# Patient Record
Sex: Male | Born: 1945 | Race: White | Hispanic: No | Marital: Married | Smoking: Never smoker
Health system: Southern US, Community
[De-identification: ages and names within clinical notes are randomized; demographics above are authoritative.]

## PROBLEM LIST (undated history)

## (undated) DIAGNOSIS — G20 Parkinson's disease: Secondary | ICD-10-CM

## (undated) DIAGNOSIS — R1310 Dysphagia, unspecified: Secondary | ICD-10-CM

## (undated) DIAGNOSIS — R471 Dysarthria and anarthria: Secondary | ICD-10-CM

## (undated) DIAGNOSIS — G20A1 Parkinson's disease without dyskinesia, without mention of fluctuations: Secondary | ICD-10-CM

## (undated) HISTORY — DX: Dysarthria and anarthria: R47.1

## (undated) HISTORY — DX: Parkinson's disease: G20

## (undated) HISTORY — DX: Dysphagia, unspecified: R13.10

## (undated) HISTORY — DX: Parkinson's disease without dyskinesia, without mention of fluctuations: G20.A1

---

## 1998-07-10 ENCOUNTER — Emergency Department (HOSPITAL_COMMUNITY): Admission: EM | Admit: 1998-07-10 | Discharge: 1998-07-10 | Payer: Self-pay | Admitting: Emergency Medicine

## 1998-12-12 ENCOUNTER — Encounter: Payer: Self-pay | Admitting: Emergency Medicine

## 1998-12-12 ENCOUNTER — Inpatient Hospital Stay (HOSPITAL_COMMUNITY): Admission: EM | Admit: 1998-12-12 | Discharge: 1998-12-15 | Payer: Self-pay | Admitting: Emergency Medicine

## 1999-04-12 ENCOUNTER — Encounter: Payer: Self-pay | Admitting: Emergency Medicine

## 1999-04-12 ENCOUNTER — Inpatient Hospital Stay (HOSPITAL_COMMUNITY): Admission: EM | Admit: 1999-04-12 | Discharge: 1999-04-16 | Payer: Self-pay | Admitting: Emergency Medicine

## 1999-04-12 ENCOUNTER — Encounter: Payer: Self-pay | Admitting: Orthopedic Surgery

## 1999-04-13 ENCOUNTER — Encounter: Payer: Self-pay | Admitting: Orthopedic Surgery

## 2003-05-14 ENCOUNTER — Emergency Department (HOSPITAL_COMMUNITY): Admission: EM | Admit: 2003-05-14 | Discharge: 2003-05-14 | Payer: Self-pay | Admitting: Emergency Medicine

## 2006-01-18 ENCOUNTER — Encounter: Admission: RE | Admit: 2006-01-18 | Discharge: 2006-01-18 | Payer: Self-pay | Admitting: Gastroenterology

## 2006-01-25 ENCOUNTER — Ambulatory Visit (HOSPITAL_COMMUNITY): Admission: RE | Admit: 2006-01-25 | Discharge: 2006-01-25 | Payer: Self-pay | Admitting: Gastroenterology

## 2006-04-12 ENCOUNTER — Encounter: Admission: RE | Admit: 2006-04-12 | Discharge: 2006-07-11 | Payer: Self-pay | Admitting: Otolaryngology

## 2006-05-01 ENCOUNTER — Encounter: Admission: RE | Admit: 2006-05-01 | Discharge: 2006-05-01 | Payer: Self-pay | Admitting: Neurology

## 2006-05-28 ENCOUNTER — Ambulatory Visit (HOSPITAL_COMMUNITY): Admission: RE | Admit: 2006-05-28 | Discharge: 2006-05-28 | Payer: Self-pay | Admitting: Otolaryngology

## 2006-06-28 ENCOUNTER — Ambulatory Visit (HOSPITAL_COMMUNITY): Admission: RE | Admit: 2006-06-28 | Discharge: 2006-06-28 | Payer: Self-pay | Admitting: Otolaryngology

## 2007-08-02 ENCOUNTER — Emergency Department (HOSPITAL_COMMUNITY): Admission: EM | Admit: 2007-08-02 | Discharge: 2007-08-02 | Payer: Self-pay | Admitting: Emergency Medicine

## 2007-08-24 ENCOUNTER — Encounter (INDEPENDENT_AMBULATORY_CARE_PROVIDER_SITE_OTHER): Payer: Self-pay | Admitting: Urology

## 2007-08-24 ENCOUNTER — Ambulatory Visit (HOSPITAL_BASED_OUTPATIENT_CLINIC_OR_DEPARTMENT_OTHER): Admission: RE | Admit: 2007-08-24 | Discharge: 2007-08-24 | Payer: Self-pay | Admitting: Urology

## 2007-09-03 ENCOUNTER — Emergency Department (HOSPITAL_COMMUNITY): Admission: EM | Admit: 2007-09-03 | Discharge: 2007-09-03 | Payer: Self-pay | Admitting: Emergency Medicine

## 2007-09-15 ENCOUNTER — Emergency Department (HOSPITAL_COMMUNITY): Admission: EM | Admit: 2007-09-15 | Discharge: 2007-09-15 | Payer: Self-pay | Admitting: Emergency Medicine

## 2008-07-06 ENCOUNTER — Ambulatory Visit (HOSPITAL_BASED_OUTPATIENT_CLINIC_OR_DEPARTMENT_OTHER): Admission: RE | Admit: 2008-07-06 | Discharge: 2008-07-06 | Payer: Self-pay | Admitting: Urology

## 2009-02-12 ENCOUNTER — Emergency Department (HOSPITAL_COMMUNITY): Admission: EM | Admit: 2009-02-12 | Discharge: 2009-02-12 | Payer: Self-pay | Admitting: Emergency Medicine

## 2010-03-14 ENCOUNTER — Other Ambulatory Visit (HOSPITAL_COMMUNITY): Payer: Self-pay | Admitting: Otolaryngology

## 2010-03-18 ENCOUNTER — Ambulatory Visit (HOSPITAL_COMMUNITY)
Admission: RE | Admit: 2010-03-18 | Discharge: 2010-03-18 | Disposition: A | Discharge status: Home / Self Care | Payer: MEDICARE | Source: Ambulatory Visit | Attending: Otolaryngology | Admitting: Otolaryngology

## 2010-03-18 DIAGNOSIS — R1310 Dysphagia, unspecified: Secondary | ICD-10-CM | POA: Insufficient documentation

## 2010-03-26 LAB — DIFFERENTIAL
Basophils Absolute: 0 10*3/uL (ref 0.0–0.1)
Basophils Relative: 0 % (ref 0–1)
Eosinophils Relative: 0 % (ref 0–5)
Monocytes Absolute: 0.8 10*3/uL (ref 0.1–1.0)
Neutrophils Relative %: 73 % (ref 43–77)

## 2010-03-26 LAB — COMPREHENSIVE METABOLIC PANEL
ALT: 14 U/L (ref 0–53)
AST: 14 U/L (ref 0–37)
Albumin: 3.8 g/dL (ref 3.5–5.2)
BUN: 11 mg/dL (ref 6–23)
CO2: 23 mEq/L (ref 19–32)
Calcium: 10.8 mg/dL — ABNORMAL HIGH (ref 8.4–10.5)
Chloride: 107 mEq/L (ref 96–112)
GFR calc Af Amer: >60 mL/min (ref >60)
GFR calc non Af Amer: >60 mL/min (ref >60)
Total Bilirubin: 1.2 mg/dL (ref 0.3–1.2)
Total Protein: 6.7 g/dL (ref 6.0–8.3)

## 2010-03-26 LAB — POCT CARDIAC MARKERS: Myoglobin, poc: 50.5 ng/mL (ref 12–200)

## 2010-03-26 LAB — CBC
HCT: 44.2 % (ref 39.0–52.0)
MCHC: 35.5 g/dL (ref 30.0–36.0)
MCV: 90.2 fL (ref 78.0–100.0)
Platelets: 157 10*3/uL (ref 150–400)
WBC: 5.4 10*3/uL (ref 4.0–10.5)

## 2010-04-13 LAB — POCT I-STAT 4, (NA,K, GLUC, HGB,HCT)
Glucose, Bld: 97 mg/dL (ref 70–99)
HCT: 46 % (ref 39.0–52.0)
Hemoglobin: 15.6 g/dL (ref 13.0–17.0)
Potassium: 4 mEq/L (ref 3.5–5.1)
Sodium: 141 mEq/L (ref 135–145)

## 2010-05-07 ENCOUNTER — Ambulatory Visit: Payer: MEDICARE | Admitting: Physical Therapy

## 2010-05-07 ENCOUNTER — Ambulatory Visit: Payer: MEDICARE | Attending: Neurology | Admitting: Physical Therapy

## 2010-05-07 ENCOUNTER — Ambulatory Visit: Payer: MEDICARE | Admitting: Speech Pathology

## 2010-05-07 ENCOUNTER — Encounter: Payer: MEDICARE | Admitting: Speech Pathology

## 2010-05-07 DIAGNOSIS — Z5189 Encounter for other specified aftercare: Secondary | ICD-10-CM | POA: Insufficient documentation

## 2010-05-07 DIAGNOSIS — R279 Unspecified lack of coordination: Secondary | ICD-10-CM | POA: Insufficient documentation

## 2010-05-07 DIAGNOSIS — R1310 Dysphagia, unspecified: Secondary | ICD-10-CM | POA: Insufficient documentation

## 2010-05-07 DIAGNOSIS — G20 Parkinson's disease: Secondary | ICD-10-CM | POA: Insufficient documentation

## 2010-05-07 DIAGNOSIS — R293 Abnormal posture: Secondary | ICD-10-CM | POA: Insufficient documentation

## 2010-05-07 DIAGNOSIS — G20A1 Parkinson's disease without dyskinesia, without mention of fluctuations: Secondary | ICD-10-CM | POA: Insufficient documentation

## 2010-05-07 DIAGNOSIS — R269 Unspecified abnormalities of gait and mobility: Secondary | ICD-10-CM | POA: Insufficient documentation

## 2010-05-19 ENCOUNTER — Ambulatory Visit: Payer: MEDICARE | Admitting: Physical Therapy

## 2010-05-20 ENCOUNTER — Other Ambulatory Visit (HOSPITAL_COMMUNITY): Payer: Self-pay | Admitting: Diagnostic Neuroimaging

## 2010-05-20 NOTE — Op Note (Signed)
Melvin Howell, Melvin Howell               ACCOUNT NO.:  [id number]   MEDICAL RECORD NO.:  [id number]          PATIENT TYPE:  AMB   LOCATION:  [facility]                         FACILITY:  [facility]   PHYSICIAN:  Mark C. Vernie Ammons, M.D.  DATE OF BIRTH:  1945-10-26   DATE OF PROCEDURE:  08/24/2007  DATE OF DISCHARGE:                               OPERATIVE REPORT   PREOPERATIVE DIAGNOSES:  1. Multiple bladder calculi.  2. Benign prostatic hypertrophy with bladder outlet obstruction.   POSTOPERATIVE DIAGNOSES:  1. Multiple bladder calculi (aggregate size ~5 cm.).  2. Benign prostatic hypertrophy with bladder outlet obstruction.   PROCEDURES:  1. Cystolitholapaxy.  2. Transurethral incision of the prostate.   ANESTHESIA:  General.   SPECIMENS:  Prostate chips to pathology and stones to patient.   BLOOD LOSS:  Minimal.   DRAINS:  24-French Foley catheter.   COMPLICATIONS:  None.   INDICATIONS:  The patient is a 65 year old white male who was found to  have multiple bladder calculi after being seen in the emergency room and  obtaining a CT scan.  He was having right flank pain at the time and has  known bilateral renal calculi.  He had no ureteral stones at the time of  the CT, but multiple bladder calculi were identified.  He is brought to  the OR today for cystolitholapaxy and transurethral incision of his  prostate since he clearly has some form of obstruction causing him to be  at increased risk for stone formation.  We discussed the risks,  complications, and alternatives.  He understands and elected to proceed.   DESCRIPTION OF OPERATION:  After informed consent, the patient was  brought to the major OR, placed on the table, administered general  anesthesia, and then moved to the dorsal lithotomy position.  His  genitalia was sterilely prepped and draped and official time-out was  then performed.  Initially the 22-French cystoscope with a 12-degree  lens was introduced per urethra and was  passed down the urethra under  direct vision.  The urethra was noted to be normal down to the  sphincter, which appears intact.  The prostatic urethra revealed  trilobar hypertrophy with a high bladder neck component.  The bladder  was then entered and I noted 1+ trabeculation.  On the floor of the  bladder, multiple small stones were identified, as well as three larger  stones and some intermediate sized stones as well.  No other  inflammatory lesions or tumors were identified within the bladder upon  complete inspection.  Ureteral orifices were noted to be of normal  configuration and position.   The 1000-micron holmium laser fiber was then passed through the  cystoscope and into the bladder and this was used to fully fragment the  large stones.  I then removed the cystoscope and inserted the  resectoscope sheath with Timberlake obturator.  I then used the  Microvasive evacuator to remove all of the stone fragments and then re-  inspection revealed all stone fragments had been completely removed from  the bladder.   I then inserted the resectoscope element with  the 12-degree lens and  resected at the 6 o'clock position and then on over to the 5 and 7  o'clock positions from the bladder neck back to the veru.  I resected  away essentially the large shelf of median lobe tissue and then used the  Microvasive evacuator to remove the prostatic chips from the bladder.  These were sent to pathology.  I then re-inspected the prostatic fossa  and cauterized all bleeding points with electrocautery and re-inspected  the bladder and noted no perforation or injury.  I therefore removed the  resectoscope and inserted the 24-French Foley catheter and connected  this to closed system drainage.  I will observe the patient overnight  and will plan to discharge him in the morning after his Foley catheter  has been removed.      Mark C. Vernie Ammons, M.D.  Electronically Signed     MCO/MEDQ  D:   08/24/2007  T:  08/24/2007  Job:  [id number]

## 2010-05-20 NOTE — Op Note (Signed)
NAMEGREGOR, DERSHEM               ACCOUNT NO.:  [id number]   MEDICAL RECORD NO.:  [id number]          PATIENT TYPE:  AMB   LOCATION:  [facility]                         FACILITY:  [facility]   PHYSICIAN:  Mark C. Vernie Ammons, M.D.  DATE OF BIRTH:  31-Dec-1945   DATE OF PROCEDURE:  07/06/2008  DATE OF DISCHARGE:                               OPERATIVE REPORT   PREOPERATIVE DIAGNOSIS:  History of bilateral distal ureteral calculi.   POSTOPERATIVE DIAGNOSES:  1. History of bilateral distal ureteral calculi.  2. Bladder calculi.   PROCEDURE:  Cystoscopy, bilateral retrograde pyelograms with  interpretation, extraction of bladder calculus.   SURGEON:  Dr. Vernie Ammons.   ASSISTANT:  Peggye Pitt.   ANESTHESIA:  General.   SPECIMEN:  Stone given to the patient.   DRAINS:  None.   BLOOD LOSS:  Minimal.   COMPLICATIONS:  None.   INDICATIONS:  The patient is a 65 year old male who had a distal right  ureteral stone.  It was being followed with conservative medical  expulsive therapy.  However, the stone failed to progress.  At the time  of his most recent KUB, the stone on the right-hand side remained  present in the distal ureter.  However, a previously noted left renal  calculus was now located at the ureterovesical junction region as well.  We therefore discussed ureteroscopic extraction due to the fact that he  had bilateral ureteral calculi.  The risks, complications and  alternatives were also discussed.  He understands and has elected to  proceed.   DESCRIPTION OF OPERATION:  After informed consent, the patient was  brought to the major OR, placed on the table, administered general  anesthesia, and then moved to the dorsal lithotomy position.  I had  discussed with the patient preoperatively if he had any pain, and he  said he had no further pain, but he also reported he had not seen any  stones pass.  His preoperative KUB, however, did not reveal the stones  that I could see on KUB in my  office.  There did appear to be a  calcification in the area of the prostatic urethra.   The patient was placed on the table, administered general anesthesia,  then moved to the dorsal lithotomy position, and his genitalia was  sterilely prepped and draped.  An official time-out was then performed.   A 22-French cystoscope with 12 degrees lens was then passed per urethra  under direct vision and urethra is noted be normal down to the sphincter  which appears intact.  The prostatic urethra was noted to have mild  bilobar hypertrophy and on the floor of the prostatic urethra just  inside the bladder neck was a calculus that appeared to be adherent to  the prostatic urethral epithelium.  I initially tried to grasp this with  a nitinol basket but was unsuccessful so I used the beak of the scope to  gently nudge the stone back into the bladder.  Once the stone was in the  bladder, the scope was then advanced into the bladder as well, and I  noted 1+ trabeculation.  Ureteral orifices appeared to be of normal  configuration and position.  However, there was some edema and  inflammation associated with the right ureteral orifice.   The nitinol basket was then used to grasp the stone that was previously  located in the prostatic urethra, and this was extracted.  I reinserted  the scope and irrigated out several small stone fragments that were on  noted to be on the floor of the bladder.   A 6-French open-end ureteral catheter was then passed through the  cystoscope and into the left orifice, and left retrograde pyelogram was  performed in standard fashion using full strength contrast.  Under  direct fluoroscopy, the entire length of the ureter as well as renal  collecting system was visualized and noted to be entirely normal.  Specifically, there was no evidence of filling defects or  hydronephrosis.  It appeared the stone had passed on the left-hand side.   A right retrograde pyelogram was  performed in an identical fashion and  with identical findings.   I then drained the bladder and removed the cystoscope, and the patient  was awakened and taken to recovery room in stable and satisfactory  condition.  He tolerated the procedure well, and there were no  intraoperative complications.   At this point he is completely free of stones and has no stents.  I  therefore going to have him return to my office for follow-up KUB to  make sure he is not forming new stones in 6 months.      Mark C. Vernie Ammons, M.D.  Electronically Signed     MCO/MEDQ  D:  07/06/2008  T:  07/06/2008  Job:  [id number]

## 2010-05-22 ENCOUNTER — Ambulatory Visit: Payer: MEDICARE | Admitting: Physical Therapy
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
## 2010-05-26 ENCOUNTER — Ambulatory Visit (HOSPITAL_COMMUNITY)
Admission: RE | Admit: 2010-05-26 | Discharge: 2010-05-26 | Disposition: A | Discharge status: Home / Self Care | Payer: Medicare Other | Source: Ambulatory Visit | Attending: Diagnostic Neuroimaging | Admitting: Diagnostic Neuroimaging

## 2010-05-26 DIAGNOSIS — R1310 Dysphagia, unspecified: Secondary | ICD-10-CM | POA: Insufficient documentation

## 2010-05-26 DIAGNOSIS — G20 Parkinson's disease: Secondary | ICD-10-CM | POA: Insufficient documentation

## 2010-05-26 DIAGNOSIS — G20A1 Parkinson's disease without dyskinesia, without mention of fluctuations: Secondary | ICD-10-CM | POA: Insufficient documentation

## 2010-05-27 ENCOUNTER — Ambulatory Visit: Payer: MEDICARE | Admitting: Physical Therapy

## 2010-05-29 ENCOUNTER — Ambulatory Visit: Payer: MEDICARE | Admitting: Physical Therapy

## 2010-06-04 ENCOUNTER — Ambulatory Visit: Payer: MEDICARE

## 2010-06-04 ENCOUNTER — Ambulatory Visit: Payer: MEDICARE | Admitting: Physical Therapy

## 2010-06-06 ENCOUNTER — Ambulatory Visit: Payer: MEDICARE | Admitting: Physical Therapy

## 2010-06-10 ENCOUNTER — Ambulatory Visit: Payer: MEDICARE | Admitting: Physical Therapy

## 2010-06-12 ENCOUNTER — Ambulatory Visit: Payer: MEDICARE | Admitting: Physical Therapy

## 2010-10-03 LAB — URINE MICROSCOPIC-ADD ON

## 2010-10-03 LAB — URINALYSIS, ROUTINE W REFLEX MICROSCOPIC
Glucose, UA: NEGATIVE
Specific Gravity, Urine: 1.024
pH: 5.5

## 2012-03-28 DIAGNOSIS — G20 Parkinson's disease: Secondary | ICD-10-CM

## 2012-03-28 DIAGNOSIS — E538 Deficiency of other specified B group vitamins: Secondary | ICD-10-CM

## 2012-03-28 DIAGNOSIS — R1310 Dysphagia, unspecified: Secondary | ICD-10-CM

## 2012-03-28 DIAGNOSIS — F03918 Unspecified dementia, unspecified severity, with other behavioral disturbance: Secondary | ICD-10-CM

## 2012-03-28 DIAGNOSIS — G20A1 Parkinson's disease without dyskinesia, without mention of fluctuations: Secondary | ICD-10-CM

## 2012-03-28 DIAGNOSIS — F0391 Unspecified dementia with behavioral disturbance: Secondary | ICD-10-CM

## 2012-05-02 DIAGNOSIS — E538 Deficiency of other specified B group vitamins: Secondary | ICD-10-CM

## 2012-05-02 DIAGNOSIS — G20 Parkinson's disease: Secondary | ICD-10-CM

## 2012-05-02 DIAGNOSIS — F0391 Unspecified dementia with behavioral disturbance: Secondary | ICD-10-CM

## 2012-05-02 DIAGNOSIS — R1310 Dysphagia, unspecified: Secondary | ICD-10-CM

## 2012-05-18 DIAGNOSIS — R1310 Dysphagia, unspecified: Secondary | ICD-10-CM

## 2012-05-18 DIAGNOSIS — E538 Deficiency of other specified B group vitamins: Secondary | ICD-10-CM

## 2012-05-18 DIAGNOSIS — G20 Parkinson's disease: Secondary | ICD-10-CM

## 2012-05-18 DIAGNOSIS — F0391 Unspecified dementia with behavioral disturbance: Secondary | ICD-10-CM

## 2012-06-20 DIAGNOSIS — G20 Parkinson's disease: Secondary | ICD-10-CM

## 2012-06-20 DIAGNOSIS — R1310 Dysphagia, unspecified: Secondary | ICD-10-CM

## 2012-06-20 DIAGNOSIS — E538 Deficiency of other specified B group vitamins: Secondary | ICD-10-CM

## 2012-06-20 DIAGNOSIS — F0391 Unspecified dementia with behavioral disturbance: Secondary | ICD-10-CM

## 2012-07-27 ENCOUNTER — Non-Acute Institutional Stay (SKILLED_NURSING_FACILITY): Payer: PRIVATE HEALTH INSURANCE | Admitting: Internal Medicine

## 2012-07-27 DIAGNOSIS — F0390 Unspecified dementia without behavioral disturbance: Secondary | ICD-10-CM

## 2012-07-27 DIAGNOSIS — I10 Essential (primary) hypertension: Secondary | ICD-10-CM

## 2012-07-27 DIAGNOSIS — E538 Deficiency of other specified B group vitamins: Secondary | ICD-10-CM

## 2012-07-27 DIAGNOSIS — G20 Parkinson's disease: Secondary | ICD-10-CM

## 2012-08-26 DIAGNOSIS — I10 Essential (primary) hypertension: Secondary | ICD-10-CM | POA: Insufficient documentation

## 2012-08-26 DIAGNOSIS — E538 Deficiency of other specified B group vitamins: Secondary | ICD-10-CM | POA: Insufficient documentation

## 2012-08-26 DIAGNOSIS — F0390 Unspecified dementia without behavioral disturbance: Secondary | ICD-10-CM | POA: Insufficient documentation

## 2012-08-26 DIAGNOSIS — G20 Parkinson's disease: Secondary | ICD-10-CM | POA: Insufficient documentation

## 2012-08-26 NOTE — Progress Notes (Signed)
Patient ID: Melvin Howell, male   DOB: September 15, 1945, 67 y.o.   MRN: [id number]        PROGRESS NOTE  DATE: 07/27/2012  FACILITY: Nursing Home Location: [facility]  LEVEL OF CARE: SNF (31)  Routine Visit  CHIEF COMPLAINT:  Manage hypertension, Parkinson's disease, and dementia.    HISTORY OF PRESENT ILLNESS:  REASSESSMENT OF ONGOING PROBLEM(S):    HTN: Pt 's HTN remains stable.  Denies CP, sob, DOE, pedal edema, headaches, dizziness or visual disturbances.  No complications from the medications currently being used.  Last BP :  156/74.     PARKINSON'S DISEASE: pt's Parkinson's disease is stable.  Denies progression of sx recently.  Pt is tolerating Parkinson's disease medications without any complications.    DEMENTIA: The dementia remains stable and continues to function adequately in the current living environment with supervision.  The patient has had little changes in behavior. No complications noted from the medications presently being used.   PAST MEDICAL HISTORY : Reviewed.  No changes.  CURRENT MEDICATIONS: Reviewed per [facility]  REVIEW OF SYSTEMS:  GENERAL: no change in appetite, no fatigue, no weight changes, no fever, chills or weakness RESPIRATORY: no cough, SOB, DOE, wheezing, hemoptysis CARDIAC: no chest pain, edema or palpitations GI: no abdominal pain, diarrhea, constipation, heart burn, nausea or vomiting  PHYSICAL EXAMINATION  VS:  T 96.9      P 64     RR 16     BP 156/74    POX %     WT (Lb) 146  GENERAL: no acute distress, normal body habitus EYES: conjunctivae normal, sclerae normal, normal eye lids NECK: supple, trachea midline, no neck masses, no thyroid tenderness, no thyromegaly LYMPHATICS: no LAN in the neck, no supraclavicular LAN RESPIRATORY: breathing is even & unlabored, BS CTAB CARDIAC: RRR, no murmur,no extra heart sounds, no edema GI: abdomen soft, normal BS, no masses, no tenderness, no hepatomegaly, no  splenomegaly PSYCHIATRIC: the patient is alert & oriented to person, affect & behavior appropriate  LABS/RADIOLOGY: 06/2012:  Liver profile normal.    CBC normal.    BMP normal except calcium 11.3.    ASSESSMENT/PLAN:  Hypertension.  We will review a BP log.  Last blood pressure elevated.     Parkinson's disease.  Continue Sinemet.    Dementia.  Stable.    History of B12 deficiency.  Continue supplementation.    Hypercalcemia.  Chronic problem.  We will monitor.    Dysphagia.  Continue current dysphagia diet.    CPT CODE: 32440

## 2012-08-29 ENCOUNTER — Non-Acute Institutional Stay (SKILLED_NURSING_FACILITY): Payer: PRIVATE HEALTH INSURANCE | Admitting: Internal Medicine

## 2012-08-29 DIAGNOSIS — E538 Deficiency of other specified B group vitamins: Secondary | ICD-10-CM

## 2012-08-29 DIAGNOSIS — G20 Parkinson's disease: Secondary | ICD-10-CM

## 2012-08-29 DIAGNOSIS — I10 Essential (primary) hypertension: Secondary | ICD-10-CM

## 2012-08-29 DIAGNOSIS — G20A1 Parkinson's disease without dyskinesia, without mention of fluctuations: Secondary | ICD-10-CM

## 2012-08-29 DIAGNOSIS — F0390 Unspecified dementia without behavioral disturbance: Secondary | ICD-10-CM

## 2012-08-31 NOTE — Progress Notes (Signed)
Patient ID: Melvin Howell, male   DOB: [birth year], 67 y.o.   MRN: [id number]        PROGRESS NOTE  DATE: 08/29/2012  FACILITY: Nursing Home Location: [facility]  LEVEL OF CARE: SNF (31)  Routine Visit  CHIEF COMPLAINT:  Manage hypertension, Parkinson's disease, and dementia.    HISTORY OF PRESENT ILLNESS:  REASSESSMENT OF ONGOING PROBLEM(S):    HTN: Pt 's HTN remains stable.  Denies CP, sob, DOE, pedal edema, headaches, dizziness or visual disturbances.  No complications from the medications currently being used.  Last BP :  156/74, 118/80.     PARKINSON'S DISEASE: pt's Parkinson's disease is stable.  Denies progression of sx recently.  Pt is tolerating Parkinson's disease medications without any complications.    DEMENTIA: The dementia remains stable and continues to function adequately in the current living environment with supervision.  The patient has had little changes in behavior. No complications noted from the medications presently being used.   PAST MEDICAL HISTORY : Reviewed.  No changes.  CURRENT MEDICATIONS: Reviewed per [facility]  REVIEW OF SYSTEMS:  GENERAL: no change in appetite, no fatigue, no weight changes, no fever, chills or weakness RESPIRATORY: no cough, SOB, DOE, wheezing, hemoptysis CARDIAC: no chest pain, edema or palpitations GI: no abdominal pain, diarrhea, constipation, heart burn, nausea or vomiting  PHYSICAL EXAMINATION  VS:  T 98.6.      P 64     RR 18     BP 118/80   POX %     WT (Lb) 147  GENERAL: no acute distress, normal body habitus EYES: conjunctivae normal, sclerae normal, normal eye lids NECK: supple, trachea midline, no neck masses, no thyroid tenderness, no thyromegaly LYMPHATICS: no LAN in the neck, no supraclavicular LAN RESPIRATORY: breathing is even & unlabored, BS CTAB CARDIAC: RRR, no murmur,no extra heart sounds, no edema GI: abdomen soft, normal BS, no masses, no tenderness, no hepatomegaly, no  splenomegaly PSYCHIATRIC: the patient is alert & oriented to person, affect & behavior appropriate  LABS/RADIOLOGY: 06/2012:  Liver profile normal.    CBC normal.    BMP normal except calcium 11.3.    ASSESSMENT/PLAN:  Hypertension.  Well controlled    Parkinson's disease.  Continue Sinemet.    Dementia.  Stable.    History of B12 deficiency.  Continue supplementation.    Hypercalcemia.  Chronic problem.  We will monitor.    Dysphagia.  Continue current dysphagia diet.    CPT CODE: 78295

## 2012-09-26 ENCOUNTER — Non-Acute Institutional Stay (SKILLED_NURSING_FACILITY): Payer: PRIVATE HEALTH INSURANCE | Admitting: Internal Medicine

## 2012-09-26 DIAGNOSIS — F0390 Unspecified dementia without behavioral disturbance: Secondary | ICD-10-CM

## 2012-09-26 DIAGNOSIS — E538 Deficiency of other specified B group vitamins: Secondary | ICD-10-CM

## 2012-09-26 DIAGNOSIS — I10 Essential (primary) hypertension: Secondary | ICD-10-CM

## 2012-09-26 DIAGNOSIS — G20 Parkinson's disease: Secondary | ICD-10-CM

## 2012-09-27 NOTE — Progress Notes (Signed)
Patient ID: Melvin Howell, male   DOB: [birth year], 67 y.o.   MRN: [id number]        PROGRESS NOTE  DATE: 09/26/2012  FACILITY: Nursing Home Location: [facility]  LEVEL OF CARE: SNF (31)  Routine Visit  CHIEF COMPLAINT:  Manage hypertension, Parkinson's disease, and dementia.    HISTORY OF PRESENT ILLNESS:  REASSESSMENT OF ONGOING PROBLEM(S):    HTN: Pt 's HTN remains stable.  Denies CP, sob, DOE, pedal edema, headaches, dizziness or visual disturbances.  No complications from the medications currently being used.  Last BP :  156/74, 118/80, 140/89.     PARKINSON'S DISEASE: pt's Parkinson's disease is stable.  Denies progression of sx recently.  Pt is tolerating Parkinson's disease medications without any complications.    DEMENTIA: The dementia remains stable and continues to function adequately in the current living environment with supervision.  The patient has had little changes in behavior. No complications noted from the medications presently being used.   PAST MEDICAL HISTORY : Reviewed.  No changes.  CURRENT MEDICATIONS: Reviewed per [facility]  REVIEW OF SYSTEMS:  GENERAL: no change in appetite, no fatigue, no weight changes, no fever, chills or weakness RESPIRATORY: no cough, SOB, DOE, wheezing, hemoptysis CARDIAC: no chest pain, edema or palpitations GI: no abdominal pain, diarrhea, constipation, heart burn, nausea or vomiting  PHYSICAL EXAMINATION  VS:  T 97.2.      P 60     RR 18     BP 140/89   POX %     WT (Lb) 148  GENERAL: no acute distress, normal body habitus EYES: conjunctivae normal, sclerae normal, normal eye lids NECK: supple, trachea midline, no neck masses, no thyroid tenderness, no thyromegaly LYMPHATICS: no LAN in the neck, no supraclavicular LAN RESPIRATORY: breathing is even & unlabored, BS CTAB CARDIAC: RRR, no murmur,no extra heart sounds, no edema GI: abdomen soft, normal BS, no masses, no tenderness, no hepatomegaly, no  splenomegaly PSYCHIATRIC: the patient is alert & oriented to person, affect & behavior appropriate  LABS/RADIOLOGY:  9-14 calcium 11.5 otherwise BMP normal 06/2012:  Liver profile normal.    CBC normal.    BMP normal except calcium 11.3.    ASSESSMENT/PLAN:  Hypertension. Borderline. Will monitor  Parkinson's disease.  Continue Sinemet.    Dementia.  Stable.    History of B12 deficiency.  Continue supplementation.    Hypercalcemia.  Chronic problem.  We will monitor.    Dysphagia.  Continue current dysphagia diet.    CPT CODE: 40981

## 2012-11-01 ENCOUNTER — Non-Acute Institutional Stay (SKILLED_NURSING_FACILITY): Payer: PRIVATE HEALTH INSURANCE | Admitting: Internal Medicine

## 2012-11-01 DIAGNOSIS — G20 Parkinson's disease: Secondary | ICD-10-CM

## 2012-11-01 DIAGNOSIS — I10 Essential (primary) hypertension: Secondary | ICD-10-CM

## 2012-11-01 DIAGNOSIS — E538 Deficiency of other specified B group vitamins: Secondary | ICD-10-CM

## 2012-11-01 DIAGNOSIS — F0390 Unspecified dementia without behavioral disturbance: Secondary | ICD-10-CM

## 2012-11-04 NOTE — Progress Notes (Signed)
Patient ID: Melvin Howell, male   DOB: [birth year], 67 y.o.   MRN: [id number]        PROGRESS NOTE  DATE: 11/01/2012  FACILITY: Nursing Home Location: [facility]  LEVEL OF CARE: SNF (31)  Routine Visit  CHIEF COMPLAINT:  Manage hypertension, Parkinson's disease, and dementia.    HISTORY OF PRESENT ILLNESS:  REASSESSMENT OF ONGOING PROBLEM(S):    HTN: Pt 's HTN remains stable.  Denies CP, sob, DOE, pedal edema, headaches, dizziness or visual disturbances.  No complications from the medications currently being used.  Last BP :  156/74, 118/80, 140/89, 134/72.     PARKINSON'S DISEASE: pt's Parkinson's disease is stable.  Denies progression of sx recently.  Pt is tolerating Parkinson's disease medications without any complications.    DEMENTIA: The dementia remains stable and continues to function adequately in the current living environment with supervision.  The patient has had little changes in behavior. No complications noted from the medications presently being used.   PAST MEDICAL HISTORY : Reviewed.  No changes.  CURRENT MEDICATIONS: Reviewed per [facility]  REVIEW OF SYSTEMS: Difficult to obtain, patient is a poor historian  PHYSICAL EXAMINATION  VS:  T 98.      P 68     RR 14     BP 134/72   POX %     WT (Lb) 148  GENERAL: no acute distress, normal body habitus EYES: conjunctivae normal, sclerae normal, normal eye lids NECK: supple, trachea midline, no neck masses, no thyroid tenderness, no thyromegaly LYMPHATICS: no LAN in the neck, no supraclavicular LAN RESPIRATORY: breathing is even & unlabored, BS CTAB CARDIAC: RRR, no murmur,no extra heart sounds, no edema GI: abdomen soft, normal BS, no masses, no tenderness, no hepatomegaly, no splenomegaly PSYCHIATRIC: the patient is alert & oriented to person, affect & behavior appropriate  LABS/RADIOLOGY:  9-14 calcium 11.5 otherwise BMP normal 06/2012:  Liver profile normal.    CBC normal.    BMP normal  except calcium 11.3.    ASSESSMENT/PLAN:  Hypertension. Borderline. Will monitor  Parkinson's disease.  Continue Sinemet.    Dementia.  Stable.    History of B12 deficiency.  Continue supplementation.    Hypercalcemia.  Chronic problem.  We will monitor.    Dysphagia.  Continue current dysphagia diet.    CPT CODE: 11914

## 2012-11-22 ENCOUNTER — Non-Acute Institutional Stay (SKILLED_NURSING_FACILITY): Payer: PRIVATE HEALTH INSURANCE | Admitting: Internal Medicine

## 2012-11-22 ENCOUNTER — Encounter: Payer: Self-pay | Admitting: Internal Medicine

## 2012-11-22 DIAGNOSIS — F0390 Unspecified dementia without behavioral disturbance: Secondary | ICD-10-CM

## 2012-11-22 DIAGNOSIS — G20 Parkinson's disease: Secondary | ICD-10-CM

## 2012-11-22 DIAGNOSIS — E538 Deficiency of other specified B group vitamins: Secondary | ICD-10-CM

## 2012-11-22 DIAGNOSIS — I10 Essential (primary) hypertension: Secondary | ICD-10-CM

## 2012-11-22 NOTE — Progress Notes (Signed)
Patient ID: Melvin Howell, male   DOB: 1945/03/03, 67 y.o.   MRN: [id number]        PROGRESS NOTE  DATE: 11/22/2012  FACILITY: Nursing Home Location: [facility]  LEVEL OF CARE: SNF (31)  Routine Visit  CHIEF COMPLAINT:  Manage hypertension, Parkinson's disease, and dementia.    HISTORY OF PRESENT ILLNESS:  REASSESSMENT OF ONGOING PROBLEM(S):    HTN: Pt 's HTN remains stable.  Denies CP, sob, DOE, pedal edema, headaches, dizziness or visual disturbances.  No complications from the medications currently being used.  Last BP :  156/74, 118/80, 140/89, 134/72, 112/68.     PARKINSON'S DISEASE: pt's Parkinson's disease is stable.  Denies progression of sx recently.  Pt is tolerating Parkinson's disease medications without any complications.    DEMENTIA: The dementia remains stable and continues to function adequately in the current living environment with supervision.  The patient has had little changes in behavior. No complications noted from the medications presently being used.   PAST MEDICAL HISTORY : Reviewed.  No changes.  CURRENT MEDICATIONS: Reviewed per [facility]  REVIEW OF SYSTEMS: Difficult to obtain, patient is a poor historian  PHYSICAL EXAMINATION  VS:  T 97.2      P 78     RR 18     BP 112/68   POX %     WT (Lb) 143  GENERAL: no acute distress, normal body habitus EYES: conjunctivae normal, sclerae normal, normal eye lids NECK: supple, trachea midline, no neck masses, no thyroid tenderness, no thyromegaly LYMPHATICS: no LAN in the neck, no supraclavicular LAN RESPIRATORY: breathing is even & unlabored, BS CTAB CARDIAC: RRR, no murmur,no extra heart sounds, no edema GI: abdomen soft, normal BS, no masses, no tenderness, no hepatomegaly, no splenomegaly PSYCHIATRIC: the patient is alert & oriented to person, affect & behavior appropriate  LABS/RADIOLOGY:  8-14 TSH 1.13  9-14 calcium 11.5 otherwise BMP normal 06/2012:  Liver profile normal.    CBC  normal.    BMP normal except calcium 11.3.    ASSESSMENT/PLAN:  Hypertension. Well controlled.  Parkinson's disease.  Continue Sinemet.    Dementia.  Stable.    History of B12 deficiency.  Continue supplementation.    Hypercalcemia.  Chronic problem.  We will monitor.    Dysphagia.  Continue current dysphagia diet.  UTI-Cipro was started    CPT CODE: 40981

## 2012-12-13 ENCOUNTER — Non-Acute Institutional Stay (SKILLED_NURSING_FACILITY): Payer: PRIVATE HEALTH INSURANCE | Admitting: Internal Medicine

## 2012-12-13 DIAGNOSIS — I10 Essential (primary) hypertension: Secondary | ICD-10-CM

## 2012-12-13 DIAGNOSIS — E538 Deficiency of other specified B group vitamins: Secondary | ICD-10-CM

## 2012-12-13 DIAGNOSIS — F0390 Unspecified dementia without behavioral disturbance: Secondary | ICD-10-CM

## 2012-12-13 DIAGNOSIS — G20 Parkinson's disease: Secondary | ICD-10-CM

## 2012-12-16 ENCOUNTER — Encounter: Payer: Self-pay | Admitting: Internal Medicine

## 2012-12-16 NOTE — Progress Notes (Signed)
Patient ID: Melvin Howell, male   DOB: 1945-02-11, 67 y.o.   MRN: [id number]        PROGRESS NOTE  DATE: 12/13/2012  FACILITY: Nursing Home Location: [facility]  LEVEL OF CARE: SNF (31)  Routine Visit  CHIEF COMPLAINT:  Manage hypertension, Parkinson's disease, and dementia.    HISTORY OF PRESENT ILLNESS:  REASSESSMENT OF ONGOING PROBLEM(S):    HTN: Pt 's HTN remains stable.  Denies CP, sob, DOE, pedal edema, headaches, dizziness or visual disturbances.  No complications from the medications currently being used.  Last BP :  156/74, 118/80, 140/89, 134/72, 112/68, 132/78.     PARKINSON'S DISEASE: pt's Parkinson's disease is stable.  Denies progression of sx recently.  Pt is tolerating Parkinson's disease medications without any complications.    DEMENTIA: The dementia remains stable and continues to function adequately in the current living environment with supervision.  The patient has had little changes in behavior. No complications noted from the medications presently being used.   PAST MEDICAL HISTORY : Reviewed.  No changes.  CURRENT MEDICATIONS: Reviewed per [facility]  REVIEW OF SYSTEMS: Difficult to obtain, patient is a poor historian  PHYSICAL EXAMINATION  VS:  T 97.1      P 76     RR 18     BP 132/78   POX %     WT (Lb) 145  GENERAL: no acute distress, normal body habitus EYES: conjunctivae normal, sclerae normal, normal eye lids NECK: supple, trachea midline, no neck masses, no thyroid tenderness, no thyromegaly LYMPHATICS: no LAN in the neck, no supraclavicular LAN RESPIRATORY: breathing is even & unlabored, BS CTAB CARDIAC: RRR, no murmur,no extra heart sounds, no edema GI: abdomen soft, normal BS, no masses, no tenderness, no hepatomegaly, no splenomegaly PSYCHIATRIC: the patient is alert & oriented to person, affect & behavior appropriate  LABS/RADIOLOGY:  11-14 CBC normal, calcium 11.1, total protein 5.4 otherwise CMP normal  8-14 TSH  1.13  9-14 calcium 11.5 otherwise BMP normal 06/2012:  Liver profile normal.    CBC normal.    BMP normal except calcium 11.3.    ASSESSMENT/PLAN:  Hypertension. Well controlled.  Parkinson's disease.  Continue Sinemet.    Dementia.  Stable.    History of B12 deficiency.  Continue supplementation.    Hypercalcemia.  Chronic problem.  We will monitor.    Dysphagia.  Continue current dysphagia diet.   CPT CODE: 40981

## 2013-01-24 ENCOUNTER — Non-Acute Institutional Stay (SKILLED_NURSING_FACILITY): Payer: PRIVATE HEALTH INSURANCE | Admitting: Internal Medicine

## 2013-01-24 DIAGNOSIS — F0390 Unspecified dementia without behavioral disturbance: Secondary | ICD-10-CM

## 2013-01-24 DIAGNOSIS — G20 Parkinson's disease: Secondary | ICD-10-CM

## 2013-01-24 DIAGNOSIS — I10 Essential (primary) hypertension: Secondary | ICD-10-CM

## 2013-01-24 DIAGNOSIS — E538 Deficiency of other specified B group vitamins: Secondary | ICD-10-CM

## 2013-01-24 NOTE — Progress Notes (Signed)
Patient ID: Melvin Howell, male   DOB: [birth year], 68 y.o.   MRN: [id number]         PROGRESS NOTE  DATE: 01/24/2013  FACILITY: Nursing Home [facility]  LEVEL OF CARE: SNF (31)  Routine Visit  CHIEF COMPLAINT:  Manage hypertension, Parkinson's disease, and dementia.    HISTORY OF PRESENT ILLNESS:  REASSESSMENT OF ONGOING PROBLEM(S):    HTN: Pt 's HTN remains stable.  Denies CP, sob, DOE, pedal edema, headaches, dizziness or visual disturbances.  No complications from the medications currently being used.  Last BP :  156/74, 118/80, 140/89, 134/72, 112/68, 132/78, 106/60.     PARKINSON'S DISEASE: pt's Parkinson's disease is stable.  Denies progression of sx recently.  Pt is tolerating Parkinson's disease medications without any complications.    DEMENTIA: The dementia remains stable and continues to function adequately in the current living environment with supervision.  The patient has had little changes in behavior. No complications noted from the medications presently being used.   PAST MEDICAL HISTORY : Reviewed.  No changes.  [facility]  REVIEW OF SYSTEMS: Difficult to obtain, patient is a poor historian  PHYSICAL EXAMINATION  VS:  T 97.6      P 60    RR 18     BP 106/60  POX %     WT (Lb) 145  GENERAL: no acute distress, normal body habitus EYES: conjunctivae normal, sclerae normal, normal eye lids NECK: supple, trachea midline, no neck masses, no thyroid tenderness, no thyromegaly LYMPHATICS: no LAN in the neck, no supraclavicular LAN RESPIRATORY: breathing is even & unlabored, BS CTAB CARDIAC: RRR, no murmur,no extra heart sounds, no edema GI: abdomen soft, normal BS, no masses, no tenderness, no hepatomegaly, no splenomegaly PSYCHIATRIC: the patient is alert & oriented to person, affect & behavior appropriate  LABS/RADIOLOGY:  12-14 ionized calcium 6.4, intact PTH 61, CBC normal  11-14 CBC normal, calcium  11.1, total protein 5.4 otherwise CMP normal  8-14 TSH 1.13  9-14 calcium 11.5 otherwise BMP normal 06/2012:  Liver profile normal.    CBC normal.    BMP normal except calcium 11.3.    ASSESSMENT/PLAN:  Hypertension. Well controlled. Lisinopril was decreased.  Parkinson's disease.  Continue Sinemet.    Dementia.  Stable.    History of B12 deficiency.  Continue supplementation.    Hypercalcemia.  Chronic problem.  We will monitor.    Dysphagia.  Continue current dysphagia diet.   Protein calorie malnutrition-boost plus started  CPT CODE: [id number]

## 2013-02-21 ENCOUNTER — Encounter: Payer: Self-pay | Admitting: *Deleted

## 2013-04-11 ENCOUNTER — Emergency Department (HOSPITAL_COMMUNITY): Payer: Medicare Other

## 2013-04-11 ENCOUNTER — Encounter (HOSPITAL_COMMUNITY): Payer: Self-pay | Admitting: Emergency Medicine

## 2013-04-11 ENCOUNTER — Emergency Department (HOSPITAL_COMMUNITY)
Admission: EM | Admit: 2013-04-11 | Discharge: 2013-04-11 | Disposition: A | Discharge status: Home / Self Care | Payer: Medicare Other | Attending: Emergency Medicine | Admitting: Emergency Medicine

## 2013-04-11 DIAGNOSIS — R1310 Dysphagia, unspecified: Secondary | ICD-10-CM | POA: Insufficient documentation

## 2013-04-11 DIAGNOSIS — R471 Dysarthria and anarthria: Secondary | ICD-10-CM | POA: Insufficient documentation

## 2013-04-11 DIAGNOSIS — G20A1 Parkinson's disease without dyskinesia, without mention of fluctuations: Secondary | ICD-10-CM | POA: Insufficient documentation

## 2013-04-11 DIAGNOSIS — Y92129 Unspecified place in nursing home as the place of occurrence of the external cause: Secondary | ICD-10-CM

## 2013-04-11 DIAGNOSIS — Y93E8 Activity, other personal hygiene: Secondary | ICD-10-CM | POA: Insufficient documentation

## 2013-04-11 DIAGNOSIS — F0390 Unspecified dementia without behavioral disturbance: Secondary | ICD-10-CM

## 2013-04-11 DIAGNOSIS — G20 Parkinson's disease: Secondary | ICD-10-CM | POA: Insufficient documentation

## 2013-04-11 DIAGNOSIS — S41009A Unspecified open wound of unspecified shoulder, initial encounter: Secondary | ICD-10-CM | POA: Insufficient documentation

## 2013-04-11 DIAGNOSIS — Z79899 Other long term (current) drug therapy: Secondary | ICD-10-CM | POA: Insufficient documentation

## 2013-04-11 DIAGNOSIS — F068 Other specified mental disorders due to known physiological condition: Secondary | ICD-10-CM | POA: Insufficient documentation

## 2013-04-11 DIAGNOSIS — W19XXXA Unspecified fall, initial encounter: Secondary | ICD-10-CM

## 2013-04-11 DIAGNOSIS — W010XXA Fall on same level from slipping, tripping and stumbling without subsequent striking against object, initial encounter: Secondary | ICD-10-CM | POA: Insufficient documentation

## 2013-04-11 DIAGNOSIS — S0101XA Laceration without foreign body of scalp, initial encounter: Secondary | ICD-10-CM

## 2013-04-11 DIAGNOSIS — Y9210 Unspecified residential institution as the place of occurrence of the external cause: Secondary | ICD-10-CM | POA: Insufficient documentation

## 2013-04-11 NOTE — ED Notes (Signed)
[facility]. Pt ambulates with walker gait unsteady; pt got up to the bathroom without assist or walker and fell hitting back of head on the toliet; has 2 inch laceration on back of head. Pt c/o pain only when palpated; denies LOC; pt a&o x 4

## 2013-04-11 NOTE — ED Notes (Signed)
[facility] to [facility]; no answer

## 2013-04-11 NOTE — ED Notes (Signed)
MD at bedside. 

## 2013-04-11 NOTE — ED Provider Notes (Signed)
CSN: [id number]     Arrival date & time 04/11/13  0136 History   First MD Initiated Contact with Patient 04/11/13 0413     Chief Complaint  Patient presents with  . Fall     (Consider location/radiation/quality/duration/timing/severity/associated sxs/prior Treatment) HPI Patient is an unfortunate 68 year old man with fairly advanced dementia as well as Parkinson's disease. Has chronic dysarthria. He presents after a fall which occurred at the skilled nursing facility where he resides. Details of the fall are unavailable to me. The patient has signs of head trauma. He cannot tell me if he lost consciousness or not. He denies pain.  He denies experiencing chest pain and shortness of breath. He does not believe that he lost consciousness. Patient has chronic gait instability.  Past Medical History  Diagnosis Date  . Parkinson disease   . Dysphagia   . Dysarthria    History reviewed. No pertinent past surgical history. History reviewed. No pertinent family history. History  Substance Use Topics  . Smoking status: Never Smoker   . Smokeless tobacco: Not on file  . Alcohol Use: No    Review of Systems Unable to obtain secondary to dysarthria - please note level 5 caveat applies.     Allergies  Review of patient's allergies indicates no known allergies.  Home Medications   Current Outpatient Rx  Name  Route  Sig  Dispense  Refill  . brimonidine (ALPHAGAN P) 0.1 % SOLN   Both Eyes   Place 1 drop into both eyes 2 (two) times daily.         . carbidopa-levodopa (SINEMET IR) 25-100 MG per tablet   Oral   Take 1 tablet by mouth 3 (three) times daily.         . hydrOXYzine (ATARAX/VISTARIL) 25 MG tablet   Oral   Take 25 mg by mouth 2 (two) times daily as needed for itching.         . lactose free nutrition (BOOST PLUS) LIQD   Oral   Take 237 mLs by mouth 3 (three) times daily with meals.         Marland Kitchen lisinopril (PRINIVIL,ZESTRIL) 2.5 MG tablet   Oral   Take 2.5 mg  by mouth daily.         Marland Kitchen oxcarbazepine (TRILEPTAL) 600 MG tablet   Oral   Take 600 mg by mouth 2 (two) times daily.         . rivastigmine (EXELON) 9.5 mg/24hr   Transdermal   Place 9.5 mg onto the skin daily.         . vitamin B-12 (CYANOCOBALAMIN) 500 MCG tablet   Oral   Take 500 mcg by mouth daily.          BP 134/74  Pulse 86  Temp(Src) 98 F (36.7 C) (Oral)  Resp 18  SpO2 98% Physical Exam Gen: well developed and thin, poorly groomed Head: 3cm jagged lac to midline parietal scalp Eyes: PERL, EOMI Nose: no epistaixis or rhinorrhea Mouth/throat: mucosa is moist and pink, poor oral hygiene is noted, no intra oral trauma observed Neck: supple, non-tender c spine Lungs: CTA B, no wheezing, rhonchi or rales CV: regular rate and rythm, good distal pulses.  Abd: soft, notender, nondistended Back: no midline ttp, no cva ttp Skin: warm and dry Ext: no edema, normal to inspection, full passive ROM all major joints of all limbs, no ttp.  Neuro: dsyarthric, gait not assessed, no motor strength deficits.  Psyche; blunted affect,  calm  and cooperative.  ED Course  Procedures (including critical care time) Labs Review Labs Reviewed - No data to display Imaging Review Ct Head Wo Contrast  04/11/2013   CLINICAL DATA:  Trauma.  EXAM: CT HEAD WITHOUT CONTRAST  TECHNIQUE: Contiguous axial images were obtained from the base of the skull through the vertex without intravenous contrast.  COMPARISON:  CT HEAD W/O CM dated 02/12/2009  FINDINGS: The ventricles and sulci are normal for age. No intraparenchymal hemorrhage, mass effect nor midline shift. Patchy supratentorial white matter hypodensities are within normal range for patient's age and though non-specific suggest sequelae of chronic small vessel ischemic disease. No acute large vascular territory infarcts.  No abnormal extra-axial fluid collections. Basal cisterns are patent. Moderate calcific atherosclerosis of the carotid  siphons.  Tiny left parietal scalp hematoma with subcutaneous gas suggesting laceration, no radiopaque foreign bodies. No skull fracture. Severe osteopenia and irregular bone mineral density in the skull base is similar. Remote right zygomatic arch fracture. Visualized paranasal sinuses and mastoid aircells are well-aerated. 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 Apparent bilateral staphyloma's. The patient is edentulous.  IMPRESSION: Tiny left parietal scalp hematoma and suspected laceration without underlying skull fracture nor acute intracranial process.  Irregularity of the bone mineral density at skullbase could reflect severe osteopenia or even Paget's disease.  Apparent bilateral staphyloma's, recommend correlation with ophthalmological examination.   Electronically Signed   By: Awilda Metroourtnay  Bloomer   On: 04/11/2013 04:19    LACERATION REPAIR Performed by: Brandt LoosenManly, Julie Authorized by: Brandt LoosenManly, Julie Consent: Verbal consent obtained. Risks and benefits: risks, benefits and alternatives were discussed Consent given by: patient Patient identity confirmed: provided demographic data Prepped and Draped in normal sterile fashion Wound explored  Laceration Location: scalp  Laceration Length: 3cm  No Foreign Bodies seen or palpated  Anesthesia: local infiltration  Local anesthetic: lidocaine 1% with epinephrine  Anesthetic total: 2 ml  Irrigation method: syringe Amount of cleaning: standard  Skin closure: 3 staples   Patient tolerance: Patient tolerated the procedure well with no immediate complications.  MDM   Final diagnoses:  Laceration of scalp  Fall at nursing home  Dementia    Patient is s/p fall - likely mechanical given history of gait instability. He has a small lac to his parietal scalp which I have repaired. CT head shows no evidence of ICH or skull fx. Patient to be discharged.    Brandt LoosenJulie  Manly, MD 04/11/13 [phone]

## 2013-05-03 ENCOUNTER — Non-Acute Institutional Stay (SKILLED_NURSING_FACILITY): Payer: PRIVATE HEALTH INSURANCE | Admitting: Internal Medicine

## 2013-05-03 DIAGNOSIS — G20 Parkinson's disease: Secondary | ICD-10-CM

## 2013-05-03 DIAGNOSIS — E538 Deficiency of other specified B group vitamins: Secondary | ICD-10-CM

## 2013-05-03 DIAGNOSIS — F0390 Unspecified dementia without behavioral disturbance: Secondary | ICD-10-CM

## 2013-05-03 DIAGNOSIS — I10 Essential (primary) hypertension: Secondary | ICD-10-CM

## 2013-05-05 NOTE — Progress Notes (Signed)
Patient ID: Melvin EhlersBenjamin W Howell, male   DOB: Jul 19, 1945, 68 y.o.   MRN: [id number]          PROGRESS NOTE  DATE: 05/03/2013  FACILITY: Nursing Home [facility]  LEVEL OF CARE: SNF (31)  Routine Visit  CHIEF COMPLAINT:  Manage hypertension, Parkinson's disease, and dementia.    HISTORY OF PRESENT ILLNESS:  REASSESSMENT OF ONGOING PROBLEM(S):    HTN: Pt 's HTN remains stable.  Denies CP, sob, DOE, pedal edema, headaches, dizziness or visual disturbances.  No complications from the medications currently being used.  Last BP :  156/74, 118/80, 140/89, 134/72, 112/68, 132/78, 106/60, 124/64.     PARKINSON'S DISEASE: pt's Parkinson's disease is stable.  Denies progression of sx recently.  Pt is tolerating Parkinson's disease medications without any complications.    DEMENTIA: The dementia remains stable and continues to function adequately in the current living environment with supervision.  The patient has had little changes in behavior. No complications noted from the medications presently being used.   PAST MEDICAL HISTORY : Reviewed.  No changes.  [facility]  REVIEW OF SYSTEMS: Difficult to obtain, patient is a poor historian  PHYSICAL EXAMINATION  VS:  See VS section  GENERAL: no acute distress, normal body habitus EYES: conjunctivae normal, sclerae normal, normal eye lids NECK: supple, trachea midline, no neck masses, no thyroid tenderness, no thyromegaly LYMPHATICS: no LAN in the neck, no supraclavicular LAN RESPIRATORY: breathing is even & unlabored, BS CTAB CARDIAC: RRR, no murmur,no extra heart sounds, no edema GI: abdomen soft, normal BS, no masses, no tenderness, no hepatomegaly, no splenomegaly PSYCHIATRIC: the patient is alert & oriented to person, affect & behavior appropriate  LABS/RADIOLOGY: 4/15 CBC normal, calcium 11, total protein 5.7 otherwise CMP normal, hemoglobin A1c 5.4 12-14 ionized calcium 6.4, intact  PTH 61, CBC normal  11-14 CBC normal, calcium 11.1, total protein 5.4 otherwise CMP normal  8-14 TSH 1.13  9-14 calcium 11.5 otherwise BMP normal 06/2012:  Liver profile normal.    CBC normal.    BMP normal except calcium 11.3.    ASSESSMENT/PLAN:  Hypertension. Well controlled.   Parkinson's disease.  Continue Sinemet.    Dementia.  Stable. Seroquel was started.   History of B12 deficiency.  Supplementation was decreased.   Hypercalcemia.  Chronic problem.  We will monitor.    Dysphagia.  Continue current dysphagia diet.   Protein calorie malnutrition-boost plus started  CPT CODE: [id number]  Newton PiggGayani Y. Kerry Doryasanayaka, MD [facility] [phone]

## 2013-05-22 ENCOUNTER — Non-Acute Institutional Stay (SKILLED_NURSING_FACILITY): Payer: PRIVATE HEALTH INSURANCE | Admitting: Internal Medicine

## 2013-05-22 DIAGNOSIS — I10 Essential (primary) hypertension: Secondary | ICD-10-CM

## 2013-05-22 DIAGNOSIS — G20 Parkinson's disease: Secondary | ICD-10-CM

## 2013-05-22 DIAGNOSIS — E538 Deficiency of other specified B group vitamins: Secondary | ICD-10-CM

## 2013-05-22 DIAGNOSIS — F0390 Unspecified dementia without behavioral disturbance: Secondary | ICD-10-CM

## 2013-05-22 DIAGNOSIS — G20A1 Parkinson's disease without dyskinesia, without mention of fluctuations: Secondary | ICD-10-CM

## 2013-05-22 NOTE — Progress Notes (Signed)
Patient ID: Neta EhlersBenjamin W Mourer, male   DOB: 19-Nov-1945, 68 y.o.   MRN: [id number]           PROGRESS NOTE  DATE: 05/22/2013  FACILITY: Nursing Home [facility]  LEVEL OF CARE: SNF (31)  Routine Visit  CHIEF COMPLAINT:  Manage hypertension, Parkinson's disease, and dementia.    HISTORY OF PRESENT ILLNESS:  REASSESSMENT OF ONGOING PROBLEM(S):    HTN: Pt 's HTN remains stable.  Denies CP, sob, DOE, pedal edema, headaches, dizziness or visual disturbances.  No complications from the medications currently being used.  Last BP :  156/74, 118/80, 140/89, 134/72, 112/68, 132/78, 106/60, 124/64, 125/72.     PARKINSON'S DISEASE: pt's Parkinson's disease is stable.  Denies progression of sx recently.  Pt is tolerating Parkinson's disease medications without any complications.    DEMENTIA: The dementia remains stable and continues to function adequately in the current living environment with supervision.  The patient has had little changes in behavior. No complications noted from the medications presently being used.   PAST MEDICAL HISTORY : Reviewed.  No changes.  CURRENT MEDICATIONS: [facility]  REVIEW OF SYSTEMS: Difficult to obtain, patient is a poor historian  PHYSICAL EXAMINATION  VS:  See VS section  GENERAL: no acute distress, normal body habitus EYES: conjunctivae normal, sclerae normal, normal eye lids NECK: supple, trachea midline, no neck masses, no thyroid tenderness, no thyromegaly LYMPHATICS: no LAN in the neck, no supraclavicular LAN RESPIRATORY: breathing is even & unlabored, BS CTAB CARDIAC: RRR, no murmur,no extra heart sounds, no edema GI: abdomen soft, normal BS, no masses, no tenderness, no hepatomegaly, no splenomegaly PSYCHIATRIC: the patient is alert & oriented to person, affect & behavior appropriate  LABS/RADIOLOGY: 4/15 CBC normal, calcium 11, total protein 5.7 otherwise CMP normal, hemoglobin A1c 5.4 12-14 ionized calcium  6.4, intact PTH 61, CBC normal  11-14 CBC normal, calcium 11.1, total protein 5.4 otherwise CMP normal  8-14 TSH 1.13  9-14 calcium 11.5 otherwise BMP normal 06/2012:  Liver profile normal.    CBC normal.    BMP normal except calcium 11.3.    ASSESSMENT/PLAN:  Hypertension. Well controlled.   Parkinson's disease.  Continue Sinemet.    Dementia.  Stable. Seroquel was started.   History of B12 deficiency.  Supplementation was decreased.   Hypercalcemia.  Chronic problem.  We will monitor.    Dysphagia.  Continue current dysphagia diet.   Protein calorie malnutrition-boost plus started  CPT CODE: [id number]  Newton PiggGayani Y. Kerry Doryasanayaka, MD [facility] [phone]

## 2013-06-19 ENCOUNTER — Non-Acute Institutional Stay (SKILLED_NURSING_FACILITY): Payer: PRIVATE HEALTH INSURANCE | Admitting: Internal Medicine

## 2013-06-19 DIAGNOSIS — G20 Parkinson's disease: Secondary | ICD-10-CM

## 2013-06-19 DIAGNOSIS — F0390 Unspecified dementia without behavioral disturbance: Secondary | ICD-10-CM

## 2013-06-19 DIAGNOSIS — E538 Deficiency of other specified B group vitamins: Secondary | ICD-10-CM

## 2013-06-19 DIAGNOSIS — I10 Essential (primary) hypertension: Secondary | ICD-10-CM

## 2013-06-19 NOTE — Progress Notes (Signed)
Patient ID: Neta EhlersBenjamin W Gargus, male   DOB: 12-11-1945, 68 y.o.   MRN: [id number]         PROGRESS NOTE  DATE: 06/19/2013  FACILITY: Nursing Home [facility]  LEVEL OF CARE: SNF (31)  Routine Visit  CHIEF COMPLAINT:  Manage hypertension, Parkinson's disease, and dementia.    HISTORY OF PRESENT ILLNESS:  REASSESSMENT OF ONGOING PROBLEM(S):    HTN: Pt 's HTN remains stable.  Denies CP, sob, DOE, pedal edema, headaches, dizziness or visual disturbances.  No complications from the medications currently being used.  Last BP :  156/74, 118/80, 140/89, 134/72, 112/68, 132/78, 106/60, 124/64, 125/72, 132/72.     PARKINSON'S DISEASE: pt's Parkinson's disease is stable.  Denies progression of sx recently.  Pt is tolerating Parkinson's disease medications without any complications.    DEMENTIA: The dementia remains stable and continues to function adequately in the current living environment with supervision.  The patient has had little changes in behavior. No complications noted from the medications presently being used.   PAST MEDICAL HISTORY : Reviewed.  No changes.  CURRENT MEDICATIONS: [facility]  REVIEW OF SYSTEMS: Difficult to obtain, patient is a poor historian  PHYSICAL EXAMINATION  VS:  See VS section  GENERAL: no acute distress, normal body habitus EYES: conjunctivae normal, sclerae normal, normal eye lids NECK: supple, trachea midline, no neck masses, no thyroid tenderness, no thyromegaly LYMPHATICS: no LAN in the neck, no supraclavicular LAN RESPIRATORY: breathing is even & unlabored, BS CTAB CARDIAC: RRR, no murmur,no extra heart sounds, no edema GI: abdomen soft, normal BS, no masses, no tenderness, no hepatomegaly, no splenomegaly PSYCHIATRIC: the patient is alert & oriented to person, affect & behavior appropriate  LABS/RADIOLOGY: 6-15 CBC normal, calcium 11.1 otherwise BMP normal 4/15 CBC normal, calcium 11, total protein 5.7 otherwise  CMP normal, hemoglobin A1c 5.4 12-14 ionized calcium 6.4, intact PTH 61, CBC normal  11-14 CBC normal, calcium 11.1, total protein 5.4 otherwise CMP normal  8-14 TSH 1.13  9-14 calcium 11.5 otherwise BMP normal 06/2012:  Liver profile normal.    CBC normal.    BMP normal except calcium 11.3.    ASSESSMENT/PLAN:  Hypertension. Well controlled.   Parkinson's disease.  Continue Sinemet.    Dementia.  Stable. Seroquel was increased.   History of B12 deficiency.  Continue Supplementation  Hypercalcemia.  Chronic problem.  We will monitor.    Dysphagia.  Continue current dysphagia diet.   Protein calorie malnutrition-Remeron was started  CPT CODE: [id number]  Newton PiggGayani Y. Kerry Doryasanayaka, MD [facility] [phone]

## 2013-07-13 ENCOUNTER — Non-Acute Institutional Stay (SKILLED_NURSING_FACILITY): Payer: PRIVATE HEALTH INSURANCE | Admitting: Internal Medicine

## 2013-07-13 DIAGNOSIS — F0390 Unspecified dementia without behavioral disturbance: Secondary | ICD-10-CM

## 2013-07-13 DIAGNOSIS — E538 Deficiency of other specified B group vitamins: Secondary | ICD-10-CM

## 2013-07-13 DIAGNOSIS — I10 Essential (primary) hypertension: Secondary | ICD-10-CM

## 2013-07-13 DIAGNOSIS — G20 Parkinson's disease: Secondary | ICD-10-CM

## 2013-07-13 NOTE — Progress Notes (Signed)
Patient ID: Neta EhlersBenjamin W Jollie, male   DOB: 12/31/1945, 68 y.o.   MRN: [id number]         PROGRESS NOTE  DATE: 07/13/2013  FACILITY: Nursing Home [facility]  LEVEL OF CARE: SNF (31)  Routine Visit  CHIEF COMPLAINT:  Manage hypertension, Parkinson's disease, and dementia.    HISTORY OF PRESENT ILLNESS:  REASSESSMENT OF ONGOING PROBLEM(S):    HTN: Pt 's HTN remains stable.  Denies CP, sob, DOE, pedal edema, headaches, dizziness or visual disturbances.  No complications from the medications currently being used.  Last BP :  156/74, 118/80, 140/89, 134/72, 112/68, 132/78, 106/60, 124/64, 125/72, 132/72, 132/76.     PARKINSON'S DISEASE: pt's Parkinson's disease is stable.  Denies progression of sx recently.  Pt is tolerating Parkinson's disease medications without any complications.    DEMENTIA: The dementia remains stable and continues to function adequately in the current living environment with supervision.  The patient has had little changes in behavior. No complications noted from the medications presently being used.   PAST MEDICAL HISTORY : Reviewed.  No changes.  CURRENT MEDICATIONS: [facility]  REVIEW OF SYSTEMS: Difficult to obtain, patient is a poor historian  PHYSICAL EXAMINATION  VS:  See VS section  GENERAL: no acute distress, normal body habitus EYES: conjunctivae normal, sclerae normal, normal eye lids NECK: supple, trachea midline, no neck masses, no thyroid tenderness, no thyromegaly LYMPHATICS: no LAN in the neck, no supraclavicular LAN RESPIRATORY: breathing is even & unlabored, BS CTAB CARDIAC: RRR, no murmur,no extra heart sounds, no edema GI: abdomen soft, normal BS, no masses, no tenderness, no hepatomegaly, no splenomegaly PSYCHIATRIC: the patient is alert & oriented to person, affect & behavior appropriate  LABS/RADIOLOGY: 6-15 CBC normal, calcium 11.1 otherwise BMP normal 4/15 CBC normal, calcium 11, total protein 5.7  otherwise CMP normal, hemoglobin A1c 5.4 12-14 ionized calcium 6.4, intact PTH 61, CBC normal  11-14 CBC normal, calcium 11.1, total protein 5.4 otherwise CMP normal  8-14 TSH 1.13  9-14 calcium 11.5 otherwise BMP normal 06/2012:  Liver profile normal.    CBC normal.    BMP normal except calcium 11.3.    ASSESSMENT/PLAN:  Hypertension. Well controlled.   Parkinson's disease.  Continue Sinemet.    Dementia.  Advanced.  History of B12 deficiency.  Continue Supplementation  Hypercalcemia.  Chronic problem.  We will monitor.    Dysphagia.  Continue current dysphagia diet.   Protein calorie malnutrition-on Remeron  CPT CODE: [id number]  Newton PiggGayani Y. Kerry Doryasanayaka, MD [facility] [phone]

## 2013-08-21 ENCOUNTER — Non-Acute Institutional Stay (SKILLED_NURSING_FACILITY): Payer: PRIVATE HEALTH INSURANCE | Admitting: Internal Medicine

## 2013-08-21 DIAGNOSIS — E538 Deficiency of other specified B group vitamins: Secondary | ICD-10-CM

## 2013-08-21 DIAGNOSIS — I10 Essential (primary) hypertension: Secondary | ICD-10-CM

## 2013-08-21 DIAGNOSIS — F0390 Unspecified dementia without behavioral disturbance: Secondary | ICD-10-CM

## 2013-08-21 DIAGNOSIS — G20 Parkinson's disease: Secondary | ICD-10-CM

## 2013-08-22 NOTE — Progress Notes (Signed)
Patient ID: Neta EhlersBenjamin W Paiva, male   DOB: July 12, 1945, 68 y.o.   MRN: [id number]         PROGRESS NOTE  DATE: 08/21/2013  FACILITY: Nursing Home [facility]  LEVEL OF CARE: SNF (31)  Routine Visit  CHIEF COMPLAINT:  Manage hypertension, Parkinson's disease, and dementia.    HISTORY OF PRESENT ILLNESS:  REASSESSMENT OF ONGOING PROBLEM(S):    HTN: Pt 's HTN remains stable.  Denies CP, sob, DOE, pedal edema, headaches, dizziness or visual disturbances.  No complications from the medications currently being used.  Last BP :  156/74, 118/80, 140/89, 134/72, 112/68, 132/78, 106/60, 124/64, 125/72, 132/72, 132/76, 134/72.     PARKINSON'S DISEASE: pt's Parkinson's disease is stable.  Denies progression of sx recently.  Pt is tolerating Parkinson's disease medications without any complications.    DEMENTIA: The dementia remains stable and continues to function adequately in the current living environment with supervision.  The patient has had little changes in behavior. No complications noted from the medications presently being used.   PAST MEDICAL HISTORY : Reviewed.  No changes.  [facility]  REVIEW OF SYSTEMS: Difficult to obtain, patient is a poor historian  PHYSICAL EXAMINATION  VS:  See VS section  GENERAL: no acute distress, normal body habitus EYES: conjunctivae normal, sclerae normal, normal eye lids NECK: supple, trachea midline, no neck masses, no thyroid tenderness, no thyromegaly LYMPHATICS: no LAN in the neck, no supraclavicular LAN RESPIRATORY: breathing is even & unlabored, BS CTAB CARDIAC: RRR, no murmur,no extra heart sounds, no edema GI: abdomen soft, normal BS, no masses, no tenderness, no hepatomegaly, no splenomegaly PSYCHIATRIC: the patient is alert & oriented to person, affect & behavior appropriate  LABS/RADIOLOGY: 6-15 CBC normal, calcium 11.1 otherwise BMP normal 4/15 CBC normal, calcium 11, total protein  5.7 otherwise CMP normal, hemoglobin A1c 5.4 12-14 ionized calcium 6.4, intact PTH 61, CBC normal  11-14 CBC normal, calcium 11.1, total protein 5.4 otherwise CMP normal  8-14 TSH 1.13  9-14 calcium 11.5 otherwise BMP normal 06/2012:  Liver profile normal.    CBC normal.    BMP normal except calcium 11.3.    ASSESSMENT/PLAN:  Hypertension. Well controlled.   Parkinson's disease.  Continue Sinemet.    Dementia.  Advanced. Seroquel was decreased.  History of B12 deficiency.  Continue Supplementation  Hypercalcemia.  Chronic problem.  We will monitor.    Dysphagia.  Continue current dysphagia diet.   Protein calorie malnutrition-continue dietary supplements.  CPT CODE: [id number]  Newton PiggGayani Y. Kerry Doryasanayaka, MD [facility] [phone]

## 2013-08-28 ENCOUNTER — Encounter: Payer: Self-pay | Admitting: *Deleted

## 2013-09-18 ENCOUNTER — Non-Acute Institutional Stay (SKILLED_NURSING_FACILITY): Payer: PRIVATE HEALTH INSURANCE | Admitting: Internal Medicine

## 2013-09-18 DIAGNOSIS — E538 Deficiency of other specified B group vitamins: Secondary | ICD-10-CM

## 2013-09-18 DIAGNOSIS — G20 Parkinson's disease: Secondary | ICD-10-CM

## 2013-09-18 DIAGNOSIS — I10 Essential (primary) hypertension: Secondary | ICD-10-CM

## 2013-09-18 DIAGNOSIS — F0390 Unspecified dementia without behavioral disturbance: Secondary | ICD-10-CM

## 2013-09-18 NOTE — Progress Notes (Signed)
Patient ID: Melvin Howell, male   DOB: 1945/01/16, 68 y.o.   MRN: [id number]         PROGRESS NOTE  DATE: 09/18/2013  FACILITY: Nursing Home Location: [facility]  LEVEL OF CARE: SNF (31)  Routine Visit  CHIEF COMPLAINT:  Manage hypertension, Parkinson's disease, and dementia.    HISTORY OF PRESENT ILLNESS:  REASSESSMENT OF ONGOING PROBLEM(S):    HTN: Pt 's HTN remains stable.  Denies CP, sob, DOE, pedal edema, headaches, dizziness or visual disturbances.  No complications from the medications currently being used.  Last BP :  156/74, 118/80, 140/89, 134/72, 112/68, 132/78, 106/60, 124/64, 125/72, 132/72, 132/76, 134/72, 110/70.     PARKINSON'S DISEASE: pt's Parkinson's disease is stable.  Denies progression of sx recently.  Pt is tolerating Parkinson's disease medications without any complications.    DEMENTIA: The dementia remains stable and continues to function adequately in the current living environment with supervision.  The patient has had little changes in behavior. No complications noted from the medications presently being used.   PAST MEDICAL HISTORY : Reviewed.  No changes.  CURRENT MEDICATIONS: Reviewed per [facility]  REVIEW OF SYSTEMS: Difficult to obtain, patient is a poor historian  PHYSICAL EXAMINATION  VS:  See VS section  GENERAL: no acute distress, normal body habitus EYES: conjunctivae normal, sclerae normal, normal eye lids NECK: supple, trachea midline, no neck masses, no thyroid tenderness, no thyromegaly LYMPHATICS: no LAN in the neck, no supraclavicular LAN RESPIRATORY: breathing is even & unlabored, BS CTAB CARDIAC: RRR, no murmur,no extra heart sounds, no edema GI: abdomen soft, normal BS, no masses, no tenderness, no hepatomegaly, no splenomegaly PSYCHIATRIC: the patient is alert & oriented to person, affect & behavior appropriate  LABS/RADIOLOGY: 8-15 TSH 0.812 6-15 CBC normal, calcium 11.1 otherwise BMP normal 4/15 CBC normal,  calcium 11, total protein 5.7 otherwise CMP normal, hemoglobin A1c 5.4 12-14 ionized calcium 6.4, intact PTH 61, CBC normal  11-14 CBC normal, calcium 11.1, total protein 5.4 otherwise CMP normal  8-14 TSH 1.13  9-14 calcium 11.5 otherwise BMP normal 06/2012:  Liver profile normal.    CBC normal.    BMP normal except calcium 11.3.    ASSESSMENT/PLAN:  Hypertension. Well controlled.   Parkinson's disease.  Continue Sinemet.    Dementia.  Advanced. Seroquel was discontinued  History of B12 deficiency.  Continue Supplementation  Hypercalcemia.  Chronic problem.  We will monitor.    Dysphagia.  Continue current dysphagia diet.   Protein calorie malnutrition-continue dietary supplements.  CPT CODE: 40981  Newton Pigg. Kerry Dory, MD [facility] [phone]

## 2013-10-16 ENCOUNTER — Non-Acute Institutional Stay (SKILLED_NURSING_FACILITY): Payer: PRIVATE HEALTH INSURANCE | Admitting: Internal Medicine

## 2013-10-16 DIAGNOSIS — E538 Deficiency of other specified B group vitamins: Secondary | ICD-10-CM

## 2013-10-16 DIAGNOSIS — F0390 Unspecified dementia without behavioral disturbance: Secondary | ICD-10-CM

## 2013-10-16 DIAGNOSIS — I10 Essential (primary) hypertension: Secondary | ICD-10-CM

## 2013-10-16 DIAGNOSIS — G20 Parkinson's disease: Secondary | ICD-10-CM

## 2013-10-17 DIAGNOSIS — E538 Deficiency of other specified B group vitamins: Secondary | ICD-10-CM | POA: Insufficient documentation

## 2013-10-17 NOTE — Progress Notes (Signed)
Patient ID: Melvin Howell, male   DOB: 06-21-1945, 68 y.o.   MRN: [id number]         PROGRESS NOTE  DATE: 10/16/2013  FAC[facility]: Nursing Home [facility]  LEVEL OF CARE: SNF (31)  Routine Visit  CHIEF COMPLAINT:  Manage hypertension, Parkinson's disease, and dementia.    HISTORY OF PRESENT ILLNESS:  REASSESSMENT OF ONGOING PROBLEM(S):    HTN: Pt 's HTN remains stable.  Denies CP, sob, DOE, pedal edema, headaches, dizziness or visual disturbances.  No complications from the medications currently being used.  Last BP :  156/74, 118/80, 140/89, 134/72, 112/68, 132/78, 106/60, 124/64, 125/72, 132/72, 132/76, 134/72, 110/70, 112/60     PARKINSON'S DISEASE: pt's Parkinson's disease is stable.  Denies progression of sx recently.  Pt is tolerating Parkinson's disease medications without any complications.    DEMENTIA: The dementia remains stable and continues to function adequately in the current living environment with supervision.  The patient has had little changes in behavior. No complications noted from the medications presently being used.   PAST MEDICAL HISTORY : Reviewed.  No changes.  [facility]  REVIEW OF SYSTEMS: Difficult to obtain, patient is a poor historian  PHYSICAL EXAMINATION  VS:  See VS section  GENERAL: no acute distress, normal body habitus EYES: conjunctivae normal, sclerae normal, normal eye lids NECK: supple, trachea midline, no neck masses, no thyroid tenderness, no thyromegaly LYMPHATICS: no LAN in the neck, no supraclavicular LAN RESPIRATORY: breathing is even & unlabored, BS CTAB CARDIAC: RRR, no murmur,no extra heart sounds, no edema GI: abdomen soft, normal BS, no masses, no tenderness, no hepatomegaly, no splenomegaly PSYCHIATRIC: the patient is alert & oriented to person, affect & behavior appropriate  LABS/RADIOLOGY: 8-15 TSH 0.812 6-15 CBC normal, calcium 11.1 otherwise BMP normal 4/15 CBC  normal, calcium 11, total protein 5.7 otherwise CMP normal, hemoglobin A1c 5.4 12-14 ionized calcium 6.4, intact PTH 61, CBC normal  11-14 CBC normal, calcium 11.1, total protein 5.4 otherwise CMP normal  8-14 TSH 1.13  9-14 calcium 11.5 otherwise BMP normal 06/2012:  Liver profile normal.    CBC normal.    BMP normal except calcium 11.3.    ASSESSMENT/PLAN:  Hypertension. Well controlled.   Parkinson's disease.  Continue Sinemet.    Dementia.  Advanced.   History of B12 deficiency.  Continue Supplementation  Hypercalcemia.  Chronic problem.  We will monitor.    Dysphagia.  Continue current dysphagia diet.   Protein calorie malnutrition-continue dietary supplements.  Check liver profile  CPT CODE: [id number]  Newton PiggGayani Y. Kerry Doryasanayaka, MD [facility] [phone]

## 2014-02-08 ENCOUNTER — Other Ambulatory Visit (HOSPITAL_COMMUNITY): Payer: Self-pay | Admitting: Internal Medicine

## 2014-02-08 DIAGNOSIS — R1310 Dysphagia, unspecified: Secondary | ICD-10-CM

## 2014-02-14 ENCOUNTER — Ambulatory Visit (HOSPITAL_COMMUNITY)
Admission: RE | Admit: 2014-02-14 | Discharge: 2014-02-14 | Disposition: A | Discharge status: Home / Self Care | Payer: Medicare Other | Source: Ambulatory Visit | Attending: Internal Medicine | Admitting: Internal Medicine

## 2014-02-14 DIAGNOSIS — R1310 Dysphagia, unspecified: Secondary | ICD-10-CM

## 2014-02-14 DIAGNOSIS — G20 Parkinson's disease: Secondary | ICD-10-CM | POA: Insufficient documentation

## 2014-02-14 DIAGNOSIS — R1312 Dysphagia, oropharyngeal phase: Secondary | ICD-10-CM | POA: Diagnosis not present

## 2014-02-14 NOTE — Procedures (Signed)
Objective Swallowing Evaluation: Modified Barium Swallowing Study  Patient Details  Name: Melvin Howell MRN: [id number] Date of Birth: 1945-01-29  Today's Date: 02/14/2014 Time: SLP Start Time (ACUTE ONLY): 1348-SLP Stop Time (ACUTE ONLY): 1425 SLP Time Calculation (min) (ACUTE ONLY): 37 min  Past Medical History:  Past Medical History  Diagnosis Date  . Parkinson disease   . Dysphagia   . Dysarthria    Past Surgical History: No past surgical history on file. HPI:  HPI: 69 yo male referred for MBS presumed to be due to concerns pt may be aspirating.  Pt has h/o Parkinson's disease, dementia, HTN, dysphagia, malnutrition, B12 deficiency.  He has undergone an MBS previously in 2012 with recommendations for regular/nectar.  Pt with cervical spondylosis at C6-C7 most prominent and C5-C6 per previous imaging studies.    No Data Recorded  Assessment / Plan / Recommendation CHL IP CLINICAL IMPRESSIONS 02/14/2014  Dysphagia Diagnosis Severe oral phase dysphagia;Severe pharyngeal phase dysphagia  Clinical impression Severe oropharyngeal dysphagia - with sensorimotor deficits due to pt's dementia and Parkinson's disease.  Prior to consumption of barium for MBS, pt noted to have viscous oral secretions without his awareness and chronic nasal drainage.  SLP used oral suction to clear oral secretions prior to testing and during test to clear oral residuals.  Severely impaired oral transiting/coordination noted with boluses spiling into pharynx uncontrolled.  Pharyngeal swallow delayed resulting in gross aspiration of thin and moderate aspiration of nectar before the swallow as barium spilled into open larynx.  Severe oropharyngeal residuals noted without pt sensation nor did pt follow directions to conduct cough or dry swallows.  Barium mixes with secretions.  Tsp of thin were not aspirated but pt is likely aspirating all po intake due to level of silent dysphagia.  Pulmonary clearance may be best with  thinner consistencies.  Quesiton ability for him to maintain nutrition also with current level of dysphagia.  Defer to Md/referring SLP re: plans for pt re: nutrition.         CHL IP TREATMENT RECOMMENDATION 02/14/2014  Treatment Plan Recommendations Defer treatment plan to SLP at (Comment)     CHL IP DIET RECOMMENDATION 02/14/2014  Diet Recommendations (No Data)  Liquid Administration via (None)  Medication Administration (None)  Compensations (None)  Postural Changes and/or Swallow Maneuvers (None)        CHL IP FOLLOW UP RECOMMENDATIONS 02/14/2014  Follow up Recommendations Skilled Nursing facility                 [facility] REF[facility] 02/14/2014  Reason for Referral Objectively evaluate swallowing function     CHL IP ORAL PHASE 02/14/2014  Lips (None)  Tongue (None)  Mucous membranes (None)  Nutritional status (None)  Other (None)  Oxygen therapy (None)  Oral Phase Impaired  Oral - Pudding Teaspoon (None)  Oral - Pudding Cup (None)  Oral - Honey Teaspoon Delayed oral transit;Reduced posterior propulsion;Weak lingual manipulation;Lingual pumping;Lingual/palatal residue;Piecemeal swallowing  Oral - Honey Cup (None)  Oral - Honey Syringe (None)  Oral - Nectar Teaspoon (None)  Oral - Nectar Cup Delayed oral transit;Reduced posterior propulsion;Weak lingual manipulation;Lingual pumping;Lingual/palatal residue;Piecemeal swallowing  Oral - Nectar Straw (None)  Oral - Nectar Syringe (None)  Oral - Ice Chips (None)  Oral - Thin Teaspoon Delayed oral transit;Reduced posterior propulsion;Weak lingual manipulation;Lingual pumping;Lingual/palatal residue;Piecemeal swallowing  Oral - Thin Cup Delayed oral transit;Reduced posterior propulsion;Weak lingual manipulation;Lingual pumping;Lingual/palatal residue;Piecemeal swallowing  Oral - Thin Straw (None)  Oral - Thin Syringe (  None)  Oral - Puree Delayed oral transit;Reduced posterior propulsion;Weak lingual manipulation;Lingual  pumping;Lingual/palatal residue  Oral - Mechanical Soft (None)  Oral - Regular (None)  Oral - Multi-consistency (None)  Oral - Pill (None)  Oral Phase - Comment (None)      CHL IP PHARYNGEAL PHASE 02/14/2014  Pharyngeal Phase Impaired  Pharyngeal - Pudding Teaspoon (None)  Penetration/Aspiration details (pudding teaspoon) (None)  Pharyngeal - Pudding Cup (None)  Penetration/Aspiration details (pudding cup) (None)  Pharyngeal - Honey Teaspoon Delayed swallow initiation;Pharyngeal residue - valleculae;Pharyngeal residue - pyriform sinuses;Reduced tongue base retraction;Reduced pharyngeal peristalsis;Reduced epiglottic inversion;Reduced anterior laryngeal mobility;Reduced laryngeal elevation;Premature spillage to valleculae;Premature spillage to pyriform sinuses;Reduced airway/laryngeal closure  Penetration/Aspiration details (honey teaspoon) (None)  Pharyngeal - Honey Cup (None)  Penetration/Aspiration details (honey cup) (None)  Pharyngeal - Honey Syringe (None)  Penetration/Aspiration details (honey syringe) (None)  Pharyngeal - Nectar Teaspoon (None)  Penetration/Aspiration details (nectar teaspoon) (None)  Pharyngeal - Nectar Cup Delayed swallow initiation;Reduced laryngeal elevation;Reduced airway/laryngeal closure;Reduced anterior laryngeal mobility;Reduced epiglottic inversion;Reduced pharyngeal peristalsis;Reduced tongue base retraction;Penetration/Aspiration before swallow;Moderate aspiration;Pharyngeal residue - valleculae;Pharyngeal residue - pyriform sinuses  Penetration/Aspiration details (nectar cup) Material enters airway, passes BELOW cords without attempt by patient to eject out (silent aspiration)  Pharyngeal - Nectar Straw (None)  Penetration/Aspiration details (nectar straw) (None)  Pharyngeal - Nectar Syringe (None)  Penetration/Aspiration details (nectar syringe) (None)  Pharyngeal - Ice Chips (None)  Penetration/Aspiration details (ice chips) (None)  Pharyngeal -  Thin Teaspoon Delayed swallow initiation;Premature spillage to valleculae;Premature spillage to pyriform sinuses;Pharyngeal residue - valleculae;Pharyngeal residue - pyriform sinuses  Penetration/Aspiration details (thin teaspoon) (None)  Pharyngeal - Thin Cup Delayed swallow initiation;Penetration/Aspiration before swallow;Significant aspiration (Amount);Premature spillage to valleculae;Premature spillage to pyriform sinuses;Reduced laryngeal elevation;Reduced anterior laryngeal mobility;Reduced epiglottic inversion;Reduced pharyngeal peristalsis;Reduced tongue base retraction;Pharyngeal residue - valleculae;Pharyngeal residue - pyriform sinuses  Penetration/Aspiration details (thin cup) Material enters airway, passes BELOW cords without attempt by patient to eject out (silent aspiration)  Pharyngeal - Thin Straw (None)  Penetration/Aspiration details (thin straw) (None)  Pharyngeal - Thin Syringe (None)  Penetration/Aspiration details (thin syringe') (None)  Pharyngeal - Puree Delayed swallow initiation;Premature spillage to valleculae;Reduced pharyngeal peristalsis;Reduced tongue base retraction;Reduced epiglottic inversion;Reduced anterior laryngeal mobility;Reduced laryngeal elevation;Reduced airway/laryngeal closure;Pharyngeal residue - valleculae;Pharyngeal residue - pyriform sinuses;Other (Comment)  Penetration/Aspiration details (puree) (None)  Pharyngeal - Mechanical Soft (None)  Penetration/Aspiration details (mechanical soft) (None)  Pharyngeal - Regular (None)  Penetration/Aspiration details (regular) (None)  Pharyngeal - Multi-consistency (None)  Penetration/Aspiration details (multi-consistency) (None)  Pharyngeal - Pill (None)  Penetration/Aspiration details (pill) (None)  Pharyngeal Comment (None)                                                       CHL IP GO 02/14/2014  Functional Assessment Tool Used MBS, clinical judgement  Functional Limitations Swallowing   Swallow Current Status (Z6109) CM  Swallow Goal Status (U0454) CM  Swallow Discharge Status (U9811) CM  Donavan Burnet, MS [facility] SLP [phone]

## 2014-03-06 DEATH — deceased

## 2015-03-27 IMAGING — RF DG SWALLOWING FUNCTION
1 series · 1 of 1 positions shown · non-contrast
Comparison: none

[Series 1: run · 1 of 1 slices shown]
[im 1/1]
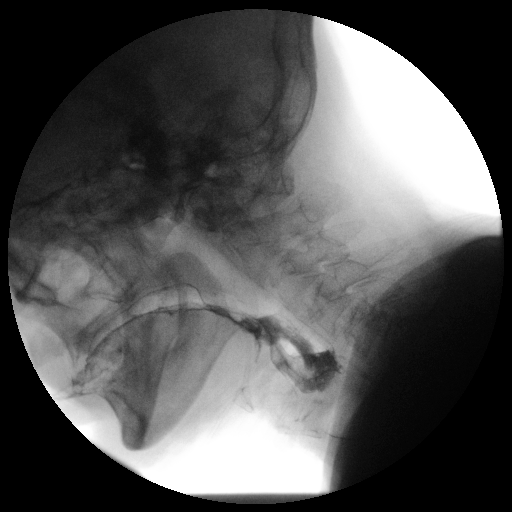

[1 of 1 positions shown; findings below may reference images not displayed]

FLUOROSCOPY FOR SWALLOWING FUNCTION STUDY:
Fluoroscopy was provided for swallowing function study, which was administered by a speech pathologist.  Final results and recommendations from this study are contained within the speech pathology report.
